# Patient Record
Sex: Male | Born: 1985 | Race: Black or African American | Hispanic: No | Marital: Single | Smoking: Never smoker
Health system: Southern US, Community
[De-identification: ages and names within clinical notes are randomized; demographics above are authoritative.]

## PROBLEM LIST (undated history)

## (undated) DIAGNOSIS — I4891 Unspecified atrial fibrillation: Secondary | ICD-10-CM

## (undated) DIAGNOSIS — R5600 Simple febrile convulsions: Secondary | ICD-10-CM

## (undated) DIAGNOSIS — I517 Cardiomegaly: Secondary | ICD-10-CM

## (undated) DIAGNOSIS — F8081 Childhood onset fluency disorder: Secondary | ICD-10-CM

## (undated) DIAGNOSIS — R9431 Abnormal electrocardiogram [ECG] [EKG]: Secondary | ICD-10-CM

## (undated) DIAGNOSIS — F410 Panic disorder [episodic paroxysmal anxiety] without agoraphobia: Secondary | ICD-10-CM

## (undated) HISTORY — DX: Unspecified atrial fibrillation: I48.91

## (undated) HISTORY — DX: Abnormal electrocardiogram (ECG) (EKG): R94.31

## (undated) HISTORY — DX: Childhood onset fluency disorder: F80.81

## (undated) HISTORY — DX: Simple febrile convulsions: R56.00

## (undated) HISTORY — DX: Cardiomegaly: I51.7

---

## 2007-06-26 ENCOUNTER — Ambulatory Visit: Payer: Self-pay | Admitting: Cardiology

## 2007-06-26 ENCOUNTER — Emergency Department (HOSPITAL_COMMUNITY): Admission: EM | Admit: 2007-06-26 | Discharge: 2007-06-26 | Payer: Self-pay | Admitting: Emergency Medicine

## 2007-06-28 ENCOUNTER — Ambulatory Visit: Payer: Self-pay

## 2007-06-28 ENCOUNTER — Encounter: Payer: Self-pay | Admitting: Cardiology

## 2007-07-10 ENCOUNTER — Ambulatory Visit: Payer: Self-pay | Admitting: Cardiology

## 2007-08-13 ENCOUNTER — Ambulatory Visit: Payer: Self-pay | Admitting: Cardiology

## 2008-09-08 ENCOUNTER — Encounter (INDEPENDENT_AMBULATORY_CARE_PROVIDER_SITE_OTHER): Payer: Self-pay | Admitting: *Deleted

## 2010-06-28 NOTE — Assessment & Plan Note (Signed)
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  We had seen him in the  Emergency Room at [facility].  He had a spell of feeling nauseated with  decreased vision and near syncope after working out on a treadmill.  He  stabilized.  He had abnormal T-waves and we saw him in consultation.  We  allowed him to go home.  His 2-D echo was done on Jun 28, 2007 and he  had normal LV function with a normal ejection fraction in the 60-70%  range.  There was a trivial pericardial effusion.  He has done well.  He  has returned to his exercise.   ALLERGIES:  No known drug allergies.   MEDICATIONS:  He is on no medicines at this time.   OTHER MEDICAL PROBLEMS:  See the list below.   REVIEW OF SYSTEMS:  He feels fine and his review of systems is negative.   PHYSICAL EXAM:  VITAL SIGNS:  Blood pressure is 122/88 with a pulse of  75.  GENERAL:  The patient is oriented to person, time, and place.  Affect is  normal.  He is here with his mother today.  HEENT:  Reveals no xanthelasma.  He has normal extraocular motion.  NECK:  There are no carotid bruits.  There is no jugular venous  distention.  LUNGS:  Lungs are clear.  Respiratory effort is not labored.  CARDIAC:  Exam reveals an S1-S2.  There are no clicks or significant  murmurs.  ABDOMEN:  His abdomen is soft.  EXTREMITIES:  He has no peripheral edema.   PROBLEMS:  Include;  1. History of some febrile seizures as a child.  2. History of some type of difficulty with stuttering over time that      may have been related to medicines he took as a child, and this      resolved.  3. Episode for which he was seen in the hospital in the Emergency  Room      on Jun 26, 2007, that was probably a vagal type of event.  4. Normal LV function by echo.  5. Abnormal EKG.  We repeated an EKG today to see if the T-wave      abnormalities remained.  The frequency response is different on our      machine compared to the hospital.  All T-wave changes in general      are less marked.  Today, his EKG does show some T-wave changes,      although they are definitely less marked.  I believe overall that      he has a mildly abnormal baseline EKG.  He does not have      significant LVH by echo.  I believe that this is a normal  variant for him.  Further workup is not needed.  I do want to see      him back in 1 year to be sure that there is no other change.  He is      to go about full activities.     Luis Abed, MD, [facility]  Electronically Signed    JDK/MedQ  DD: 08/13/2007  DT: 08/14/2007  Job #: [id number]

## 2010-06-28 NOTE — Consult Note (Signed)
NAME:  AMBERS, IYENGAR NO.:  [id number]   MEDICAL RECORD NO.:  [id number]          PATIENT TYPE:  EMS   LOCATION:  [facility]                         FACILITY:  [facility]   PHYSICIAN:  Luis Abed, MD, FACCDATE OF BIRTH:  1985-10-04   DATE OF CONSULTATION:  06/26/2007  DATE OF DISCHARGE:                                 CONSULTATION   Mr. Jeffreys is currently in the emergency room.  He is 25 years of age.  He has no history of any significant cardiac problems.  In speaking to  his mother who is now here also, he did have a problem with febrile  seizures as a child.  He was on several medications in another area and  followed by a pediatric neurologist.  The mother tells me that he  received many medications, and, at a certain point, may have had some  reactions to them with some stuttering.  Ultimately, he was taken off  all of these medicines and he has developed normally and is doing well.  He has not had any seizures in many years.   He is a Holiday representative at [employer].  He has been doing some physical workouts on a  regular basis which include some walking on the treadmill and some  weight work.  Today he ate a little different than usual, but his volume  status seemed to be okay according to him.  While doing some of his  workout he felt a nauseated sensation.  This persisted for a while, and  he then tried to go towards the bathroom and felt worse.  He had a  decrease in vision for a short period of time.  Ultimately, he was  brought to [facility] for evaluation.  It turns out that his workout  today was harder than usual, and he had not worked out for a couple of  weeks.   He did have some vomiting.  There was no documented seizure.   PAST MEDICAL HISTORY:   ALLERGIES:  No known drug allergies.   MEDICATIONS:  He is on no medications.   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  He is  planning to go to summer school.  He does not smoke.  He does  not abuse  drugs, and he does not drink alcohol.   FAMILY HISTORY:  There is no significant family history of coronary  disease, and there is no family history of sudden cardiac death.   PHYSICAL EXAMINATION:  The patient is oriented to person, time, and  place.  Affect is normal.  Temperature is 97.3, blood pressure lying is 123/68, sitting 118/78,  standing 120/69.  There is no major change in pulse.  This suggests no  major orthostatic changes.  HEENT:  Reveals no xanthelasma.  He has normal extraocular  motion.  There are no carotid bruits.  There is no jugular venous distension.  LUNGS:  Clear.  Respiratory effort is not labored.  CARDIAC EXAM:  Reveals an S1 with an S2.  There are no clicks or  significant murmurs.  The abdomen is soft.  He has no significant peripheral edema.   EKG does show T-wave inversions in leads II, III, aVF, and V3 to V6.   Chest x-ray shows no significant abnormality.   White blood count is slightly elevated at 10.6, hemoglobin is 15.7,  platelets are normal.  Sodium 139, potassium 4.2, BUN is 10, and  creatinine 1.0.  Troponin is 0.06.   The patient received IV fluid and he feels fine.  He has also eaten  since he has been here.   IMPRESSION:  1. History of febrile seizures as a child receiving multiple      medications.  2. History of some type of difficulty with stuttering over time that      may have been related to his medicines as a child and this has      resolved.  3. Episode today which most probably was a vagal reaction to not      feeling well after pushing himself with his physical exercise      program.  4. Abnormal EKG.  We have no EKG for old EKG for comparison.  I am not      convinced that this represents an acute abnormality.  He is stable      and  can go home.  He will not be exercising again until we have      him seen in the office tomorrow.  We are making arrangements for      him to have a 2-D echo and then to be seen  in followup by one of      the other cardiology partners in my group either tomorrow or the      next day to be sure that there is continuity following the patient      along.  He is stable and allowed to go home at this time.      Luis Abed, MD, [facility]  Electronically Signed     JDK/MEDQ  D:  06/26/2007  T:  06/26/2007  Job:  [id number]

## 2010-06-28 NOTE — Assessment & Plan Note (Signed)
[facility]                            CARDIOLOGY OFFICE NOTE   CARRY, ORTEZ                       MRN:          [id number]  DATE:07/10/2007                            DOB:          [birth year]    The patient seen in the [facility] clinic on Jul 10, 2007 for Dr. Simona Huh.   PRIMARY CARDIOLOGIST:  Dr. Willa Rough.   This is a 25 year old African-American male patient that Dr. Myrtis Ser saw in  the emergency room when he became nauseated and decrease in vision after  a treadmill workout and weight workout.  EKG was abnormal with T wave  inversion inferolaterally, but we had no EKG to compare it to.  He had  negative enzymes, and Dr. Myrtis Ser felt it was okay for him to go home and  have an outpatient 2-D echo.  2-D echo was performed on Jun 28, 2007.  It showed normal LV function, ejection fraction 60-70%.  There was  trivial pericardial effusion, no regional wall motion abnormalities.  Dr. Myrtis Ser looked it over and felt it was fine.   The patient has had no further episodes of nausea and visual  disturbances.  Denies any cardiac symptoms.  His  mother is concerned  because his blood pressure was up a little today here.  It was 149/79;  when I retook it was 140/80, but he did have chips and a [place] sandwich  right before he came in here.   CURRENT MEDICATIONS:  None.   PHYSICAL EXAMINATION:  GENERAL:  This is a pleasant 25 year old African-  American male in no acute distress.  VITAL SIGNS:  Blood pressure 140/80, pulse 67, weight 205.  NECK:  Without JVD, HJR, bruit or thyroid enlargement.  LUNGS:  Clear anterior, posterior, and lateral.  HEART:  Regular rate and rhythm at 67 beats per minute, normal S1, S2,  no murmur, rub, bruit, thrill or heave noted.  ABDOMEN:  Soft without  organomegaly, masses, lesions or abnormal tenderness.  EXTREMITIES:  Without cyanosis, clubbing or edema.  There are good  distal pulses.   IMPRESSION:  1.  Abnormal EKG with normal left ventricle function, ejection fraction      60-70% on 2-D echocardiogram with trivial pericardial effusion.  2. History of febrile seizures as a child.  3. History of stuttering.  4. Possible vagal reaction after physical exertion.   PLAN:  The patient is stable from a cardiac standpoint.  I have asked  him to decrease his sodium intake and keep track of his blood pressure  and bring a copy of that when he sees Dr. Myrtis Ser back in one month.      Jacolyn Reedy, PA-C  Electronically Signed      Jonelle Sidle, MD  Electronically Signed   ML/MedQ  DD: 07/10/2007  DT: 07/10/2007  Job #: [id number]   cc:   Luis Abed, MD, [facility]

## 2011-06-10 ENCOUNTER — Emergency Department (HOSPITAL_COMMUNITY)
Admission: EM | Admit: 2011-06-10 | Discharge: 2011-06-10 | Disposition: A | Discharge status: Home / Self Care | Payer: PRIVATE HEALTH INSURANCE | Attending: Emergency Medicine | Admitting: Emergency Medicine

## 2011-06-10 ENCOUNTER — Emergency Department (HOSPITAL_COMMUNITY): Payer: PRIVATE HEALTH INSURANCE

## 2011-06-10 ENCOUNTER — Encounter (HOSPITAL_COMMUNITY): Payer: Self-pay | Admitting: *Deleted

## 2011-06-10 DIAGNOSIS — I4891 Unspecified atrial fibrillation: Secondary | ICD-10-CM | POA: Insufficient documentation

## 2011-06-10 DIAGNOSIS — F410 Panic disorder [episodic paroxysmal anxiety] without agoraphobia: Secondary | ICD-10-CM | POA: Insufficient documentation

## 2011-06-10 DIAGNOSIS — R002 Palpitations: Secondary | ICD-10-CM | POA: Insufficient documentation

## 2011-06-10 HISTORY — DX: Panic disorder (episodic paroxysmal anxiety): F41.0

## 2011-06-10 LAB — BASIC METABOLIC PANEL
CO2: 24 mEq/L (ref 19–32)
Chloride: 106 mEq/L (ref 96–112)
GFR calc Af Amer: >90 mL/min (ref >90)
Sodium: 141 mEq/L (ref 135–145)

## 2011-06-10 LAB — CBC
Platelets: 191 10*3/uL (ref 150–400)
RDW: 12.1 % (ref 11.5–15.5)
WBC: 5.4 10*3/uL (ref 4.0–10.5)

## 2011-06-10 LAB — DIFFERENTIAL
Basophils Absolute: 0 10*3/uL (ref 0.0–0.1)
Lymphocytes Relative: 36 % (ref 12–46)
Neutro Abs: 3 10*3/uL (ref 1.7–7.7)
Neutrophils Relative %: 56 % (ref 43–77)

## 2011-06-10 LAB — TROPONIN I: Troponin I: <0.3 ng/mL (ref <0.30)

## 2011-06-10 LAB — RAPID URINE DRUG SCREEN, HOSP PERFORMED
Amphetamines: NOT DETECTED
Benzodiazepines: NOT DETECTED
Opiates: NOT DETECTED

## 2011-06-10 MED ORDER — SODIUM CHLORIDE 0.9 % IV SOLN
Freq: Once | INTRAVENOUS | Status: AC
  Administered 2011-06-10: 1000 mL via INTRAVENOUS

## 2011-06-10 MED ORDER — METOPROLOL TARTRATE 25 MG PO TABS
25.0000 mg | ORAL_TABLET | Freq: Once | ORAL | Status: AC
  Administered 2011-06-10: 25 mg via ORAL
  Filled 2011-06-10: qty 1

## 2011-06-10 MED ORDER — METOPROLOL TARTRATE 50 MG PO TABS: 25.0000 mg | ORAL_TABLET | Freq: Two times a day (BID) | ORAL | Status: DC

## 2011-06-10 NOTE — ED Provider Notes (Addendum)
7:37 AM  Date: 06/10/2011  Rate: 122  Rhythm: atrial fibrillation and premature ventricular contractions (PVC)  QRS Axis: normal  Intervals: normal  ST/T Wave abnormalities: normal  Conduction Disutrbances:  Incomplete right bundle branch block.  Narrative Interpretation: Abnormal EKG  Old EKG Reviewed: changes noted--was in sinus rhythm on 06/26/2007.   9:21 AM  Date: 06/10/2011  Rate: 83  Rhythm: normal sinus rhythm  QRS Axis: normal  Intervals: normal  ST/T Wave abnormalities: normal  Conduction Disutrbances:none  Narrative Interpretation: Normal EKG  Old EKG Reviewed: changes noted--was in atrial fibrillation with rapid rate earlier at 7:37 A.M.      Carleene Cooper III, MD 06/10/11 0740  Carleene Cooper III, MD 06/10/11 [phone]

## 2011-06-10 NOTE — ED Notes (Signed)
Pt states that he woke up because both of his legs were cramping. Pt states that he waited until they were better and then tried to walk to the bathroom. Pt in the bathroom started feeling nauseated and then he felt like he was getting diaphoretic and his heart started racing. Pt states that he was able to calm himself down and heard a friend had a panic attack and thought maybe he was having the same. Pt is alert and oriented able to follow commands and move extremities.

## 2011-06-10 NOTE — ED Provider Notes (Signed)
History     CSN: [id number]  Arrival date & time 06/10/11  4098   First MD Initiated Contact with Patient 06/10/11 0602      Chief Complaint  Patient presents with  . Panic Attack    (Consider location/radiation/quality/duration/timing/severity/associated sxs/prior treatment) HPI  This is a 26 year old male presents with a chief complaints of heart palpitations. Patient states he woke up this morning with bilateral leg cramps, which he has experienced for several months. Patient then felt nauseated. He proceeds to go into the bathroom, when he experiencing diaphoresis, increased heart palpitations. States symptoms lasting for about 5 minutes, improved, and then returns. Onset is acute, episodic, and currently continues. He denies lightheadedness, or dizziness. He denies chest pain or shortness of breath. He denies abdominal pain, back pain, or melena. This never experiencing his heart palpitation before. Patient denies history of panic attack. Patient mentioned that in 2005l he had a episode of near exertional syncopal after doing heavy lifting.  He did follow up with a cardiologist and was diagnosed with irregular heart rate.  Has not had any recent similar event.  Denies recent rec. Drug use.  Does sts he is trying to get back to working out for the past several months.  Denies significant cardiac hx of family hx of premature CAD.    Past Medical History  Diagnosis Date  . Panic attack     History reviewed. No pertinent past surgical history.  No family history on file.  History  Substance Use Topics  . Smoking status: Never Smoker   . Smokeless tobacco: Not on file  . Alcohol Use: No      Review of Systems  All other systems reviewed and are negative.    Allergies  Review of patient's allergies indicates no known allergies.  Home Medications  No current outpatient prescriptions on file.  BP 128/80  Pulse 80  Temp(Src) 98.2 F (36.8 C) (Oral)  Resp 18  SpO2  99%  Physical Exam  Nursing note and vitals reviewed. Constitutional: He appears well-developed and well-nourished. No distress.       Awake, alert, nontoxic appearance  HENT:  Head: Atraumatic.  Eyes: Conjunctivae are normal. Right eye exhibits no discharge. Left eye exhibits no discharge.  Neck: Normal range of motion. Neck supple.  Cardiovascular: An irregularly irregular rhythm present. Tachycardia present.   No murmur heard. Pulmonary/Chest: Effort normal. No respiratory distress. He exhibits no tenderness.  Abdominal: Soft. There is no tenderness. There is no rebound.  Musculoskeletal: He exhibits no edema and no tenderness.       ROM appears intact, no obvious focal weakness  Neurological: He is alert.  Skin: Skin is warm and dry. No rash noted.  Psychiatric: He has a normal mood and affect.    ED Course  Procedures (including critical care time)  Labs Reviewed - No data to display No results found.   No diagnosis found.   Date: 06/10/2011  Rate: 122  Rhythm: atrial fibrillation  QRS Axis: normal  Intervals: normal  ST/T Wave abnormalities: nonspecific ST/T changes  Conduction Disutrbances:paired PVC  Narrative Interpretation: afib with RVR, PVCs  Old EKG Reviewed: changes noted  Results for orders placed during the hospital encounter of 06/10/11  CBC      Component Value Range   WBC 5.4  4.0 - 10.5 (K/uL)   RBC 4.65  4.22 - 5.81 (MIL/uL)   Hemoglobin 15.6  13.0 - 17.0 (g/dL)   HCT 11.9  14.7 - 82.9 (%)  MCV 94.4  78.0 - 100.0 (fL)   MCH 33.5  26.0 - 34.0 (pg)   MCHC 35.5  30.0 - 36.0 (g/dL)   RDW 16.1  09.6 - 04.5 (%)   Platelets 191  150 - 400 (K/uL)  DIFFERENTIAL      Component Value Range   Neutrophils Relative 56  43 - 77 (%)   Neutro Abs 3.0  1.7 - 7.7 (K/uL)   Lymphocytes Relative 36  12 - 46 (%)   Lymphs Abs 1.9  0.7 - 4.0 (K/uL)   Monocytes Relative 7  3 - 12 (%)   Monocytes Absolute 0.4  0.1 - 1.0 (K/uL)   Eosinophils Relative 0  0 - 5 (%)    Eosinophils Absolute 0.0  0.0 - 0.7 (K/uL)   Basophils Relative 0  0 - 1 (%)   Basophils Absolute 0.0  0.0 - 0.1 (K/uL)  BASIC METABOLIC PANEL      Component Value Range   Sodium 141  135 - 145 (mEq/L)   Potassium 4.2  3.5 - 5.1 (mEq/L)   Chloride 106  96 - 112 (mEq/L)   CO2 24  19 - 32 (mEq/L)   Glucose, Bld 146 (*) 70 - 99 (mg/dL)   BUN 13  6 - 23 (mg/dL)   Creatinine, Ser 4.09  0.50 - 1.35 (mg/dL)   Calcium 9.4  8.4 - 81.1 (mg/dL)   GFR calc non Af Amer >90  >90 (mL/min)   GFR calc Af Amer >90  >90 (mL/min)  TROPONIN I      Component Value Range   Troponin I <0.30  <0.30 (ng/mL)   Dg Chest 2 View  06/10/2011  *RADIOLOGY REPORT*  Clinical Data:  Palpitations, diaphoresis and shortness of breath.  CHEST - 2 VIEW  Comparison: 06/26/2007  Findings: The heart size and mediastinal contours are within normal limits.  Both lungs are clear.  The visualized skeletal structures are unremarkable.  IMPRESSION: No active disease.  Original Report Authenticated By: Reola Calkins, M.D.      MDM  ECG shows afib with RVR.  Work up initiated.  Discussed care with my attending.    9:20 AM Pt has spontaneously convert back to sinus rhythm.  Repeat ECG performed.  My attending has consulted with Dr. Antoine Poche from [facility], who recommend for pt to start on metoprolol 25mg  PO once daily and to f/u in office for echocardiogram and further evaluation.  Pt were made aware of plan and agrees. Metoprolol given here in ED.  Prescription given.  Pt stable to be d/c.            Fayrene Helper, PA-C 06/10/11 [phone]

## 2011-06-10 NOTE — Discharge Instructions (Signed)
Please call [facility] office on Monday to schedule a follow up appointment for further evaluation of your atrial fibrillation.  Takes metoprolol 25mg  once daily.  Atrial Fibrillation Your caregiver has diagnosed you with atrial fibrillation (AFib). The heart normally beats very regularly; AFib is a type of irregular heartbeat. The heart rate may be faster or slower than normal. This can prevent your heart from pumping as well as it should. AFib can be constant (chronic) or intermittent (paroxysmal). CAUSES  Atrial fibrillation may be caused by:  Heart disease, including heart attack, coronary artery disease, heart failure, diseases of the heart valves, and others.   Blood clot in the lungs (pulmonary embolism).   Pneumonia or other infections.   Chronic lung disease.   Thyroid disease.   Toxins. These include alcohol, some medications (such as decongestant medications or diet pills), and caffeine.  In some people, no cause for AFib can be found. This is referred to as Lone Atrial Fibrillation. SYMPTOMS   Palpitations or a fluttering in your chest.   A vague sense of chest discomfort.   Shortness of breath.   Sudden onset of lightheadedness or weakness.  Sometimes, the first sign of AFib can be a complication of the condition. This could be a stroke or heart failure. DIAGNOSIS  Your description of your condition may make your caregiver suspicious of atrial fibrillation. Your caregiver will examine your pulse to determine if fibrillation is present. An EKG (electrocardiogram) will confirm the diagnosis. Further testing may help determine what caused you to have atrial fibrillation. This may include chest x-ray, echocardiogram, blood tests, or CT scans. PREVENTION  If you have previously had atrial fibrillation, your caregiver may advise you to avoid substances known to cause the condition (such as stimulant medications, and possibly caffeine or alcohol). You may be advised to  use medications to prevent recurrence. Proper treatment of any underlying condition is important to help prevent recurrence. PROGNOSIS  Atrial fibrillation does tend to become a chronic condition over time. It can cause significant complications (see below). Atrial fibrillation is not usually immediately life-threatening, but it can shorten your life expectancy. This seems to be worse in women. If you have lone atrial fibrillation and are under 26 years old, the risk of complications is very low, and life expectancy is not shortened. RISKS AND COMPLICATIONS  Complications of atrial fibrillation can include stroke, chest pain, and heart failure. Your caregiver will recommend treatments for the atrial fibrillation, as well as for any underlying conditions, to help minimize risk of complications. TREATMENT  Treatment for AFib is divided into several categories:  Treatment of any underlying condition.   Converting you out of AFib into a regular (sinus) rhythm.   Controlling rapid heart rate.   Prevention of blood clots and stroke.  Medications and procedures are available to convert your atrial fibrillation to sinus rhythm. However, recent studies have shown that this may not offer you any advantage, and cardiac experts are continuing research and debate on this topic. More important is controlling your rapid heartbeat. The rapid heartbeat causes more symptoms, and places strain on your heart. Your caregiver will advise you on the use of medications that can control your heart rate. Atrial fibrillation is a strong stroke risk. You can lessen this risk by taking blood thinning medications such as Coumadin (warfarin), or sometimes aspirin. These medications need close monitoring by your caregiver. Over-medication can cause bleeding. Too little medication may not protect against stroke. HOME CARE INSTRUCTIONS   If  your caregiver prescribed medicine to make your heartbeat more normally, take as directed.    If blood thinners were prescribed by your caregiver, take EXACTLY as directed.   Perform blood tests EXACTLY as directed.   Quit smoking. Smoking increases your cardiac and lung (pulmonary) risks.   DO NOT drink alcohol.   DO NOT drink caffeinated drinks (e.g. coffee, soda, chocolate, and leaf teas). You may drink decaffeinated coffee, soda or tea.   If you are overweight, you should choose a reduced calorie diet to lose weight. Please see a registered dietitian if you need more information about healthy weight loss. DO NOT USE DIET PILLS as they may aggravate heart problems.   If you have other heart problems that are causing AFib, you may need to eat a low salt, fat, and cholesterol diet. Your caregiver will tell you if this is necessary.   Exercise every day to improve your physical fitness. Stay active unless advised otherwise.   If your caregiver has given you a follow-up appointment, it is very important to keep that appointment. Not keeping the appointment could result in heart failure or stroke. If there is any problem keeping the appointment, you must call back to this facility for assistance.  SEEK MEDICAL CARE IF:  You notice a change in the rate, rhythm or strength of your heartbeat.   You develop an infection or any other change in your overall health status.  SEEK IMMEDIATE MEDICAL CARE IF:   You develop chest pain, abdominal pain, sweating, weakness or feel sick to your stomach (nausea).   You develop shortness of breath.   You develop swollen feet and ankles.   You develop dizziness, numbness, or weakness of your face or limbs, or any change in vision or speech.  MAKE SURE YOU:   Understand these instructions.   Will watch your condition.   Will get help right away if you are not doing well or get worse.  Document Released: 01/30/2005 Document Revised: 01/19/2011 Document Reviewed: 09/04/2007 [facility] Patient Information 2012 [place], [place].

## 2011-06-10 NOTE — ED Provider Notes (Signed)
Medical screening examination/treatment/procedure(s) were performed by non-physician practitioner and as supervising physician I was immediately available for consultation/collaboration.  Jasmine Awe, MD 06/10/11 2333

## 2011-06-10 NOTE — ED Notes (Signed)
The pt woke up approx one hour ago with bi-lateral leg cramps nauseated.  He went to the br feeling more nauseated started swaeting and feeling like his heart was racing.  He has a history of panic attacks.  At present he feels strange .  No pain

## 2011-06-14 ENCOUNTER — Encounter: Payer: Self-pay | Admitting: *Deleted

## 2011-06-15 ENCOUNTER — Encounter: Payer: Self-pay | Admitting: Cardiovascular Disease

## 2011-06-15 ENCOUNTER — Ambulatory Visit (INDEPENDENT_AMBULATORY_CARE_PROVIDER_SITE_OTHER): Payer: PRIVATE HEALTH INSURANCE | Admitting: Cardiovascular Disease

## 2011-06-15 VITALS — BP 128/73 | HR 64 | Wt 224.0 lb

## 2011-06-15 DIAGNOSIS — I480 Paroxysmal atrial fibrillation: Secondary | ICD-10-CM

## 2011-06-15 DIAGNOSIS — I493 Ventricular premature depolarization: Secondary | ICD-10-CM | POA: Insufficient documentation

## 2011-06-15 DIAGNOSIS — I4891 Unspecified atrial fibrillation: Secondary | ICD-10-CM

## 2011-06-15 DIAGNOSIS — I4949 Other premature depolarization: Secondary | ICD-10-CM

## 2011-06-15 DIAGNOSIS — R55 Syncope and collapse: Secondary | ICD-10-CM

## 2011-06-15 NOTE — Patient Instructions (Signed)
Your physician recommends that you schedule a follow-up appointment in: AFTER TEST DONE Your physician recommends that you continue on your current medications as directed. Please refer to the Current Medication list given to you today. Your physician has requested that you have an echocardiogram. Echocardiography is a painless test that uses sound waves to create images of your heart. It provides your doctor with information about the size and shape of your heart and how well your heart's chambers and valves are working. This procedure takes approximately one hour. There are no restrictions for this procedure. DX 427.31 Your physician has requested that you have an exercise tolerance test. For further information please visit [email or web address] Please also follow instruction sheet, as given. DX 427.31

## 2011-06-15 NOTE — Assessment & Plan Note (Addendum)
Previous episodes of vasovagal like syncope.  Negative w/u by Dr Myrtis Ser in 2009 F/U ETT and echo and consider MRI depending on these results  No significant family history and previous echo 2009 with no HOCM  Labile T waves noted by Dr Myrtis Ser and may make MRI useful to R.O infiltrative disease.

## 2011-06-15 NOTE — Progress Notes (Addendum)
Patient ID: Jack Jordan, male   DOB: 1985/08/15, 26 y.o.   MRN: [id number] Seen in ER 4/27 with chief complaint of  heart palpitations. Patient states he woke up in morning with bilateral leg cramps, which he has experienced for several months. Patient then felt nauseated. He proceeds to go into the bathroom, when he experiencing diaphoresis, increased heart palpitations. States symptoms lasting for about 5 minutes, improved, and then returns. Onset is acute, episodic, and currently continues. He denies lightheadedness, or dizziness. He denies chest pain or shortness of breath. He denies abdominal pain, back pain, or melena. This never experiencing his heart palpitation before. Patient denies history of panic attack. Patient mentioned that in 2009 he had a episode of near exertional syncopal after doing heavy lifting. Reviewed these records as he saw Dr Myrtis Ser 07/2007 and had normal echo with thoughts that he had a vasovagal episode.  Marland Kitchen Has not had any recent similar event. Denies recent rec. Drug use. Does sts he is trying to get back to working out for the past several months. Denies significant cardiac hx of family hx of premature CAD.   ROS: Denies fever, malais, weight loss, blurry vision, decreased visual acuity, cough, sputum, SOB, hemoptysis, pleuritic pain, palpitaitons, heartburn, abdominal pain, melena, lower extremity edema, claudication, or rash.  All other systems reviewed and negative  General: Affect appropriate Overweight black male HEENT: normal Neck supple with no adenopathy JVP normal no bruits no thyromegaly Lungs clear with no wheezing and good diaphragmatic motion Heart:  S1/S2 no murmur, no rub, gallop or click PMI normal Abdomen: benighn, BS positve, no tenderness, no AAA no bruit.  No HSM or HJR Distal pulses intact with no bruits No edema Neuro non-focal Skin warm and dry No muscular weakness   Current Outpatient Prescriptions  Medication Sig Dispense Refill  .  metoprolol (LOPRESSOR) 50 MG tablet Take 0.5 tablets (25 mg total) by mouth 2 (two) times daily.  20 tablet  0    Allergies  Review of patient's allergies indicates no known allergies.  Electrocardiogram:  4/28  Afib with PVC;s and nonspecfic ST/T wave changes  4/27  NSR rate 83  Normal  Today NSR rate 69  Inferolateral T wave changes  Assessment and Plan

## 2011-06-15 NOTE — Assessment & Plan Note (Signed)
Somewhat unusual in patient this age.  May have been vagally mediated.  F/U Echo  In NSR and will continue lopresser for now  ASA

## 2011-06-15 NOTE — Assessment & Plan Note (Signed)
Normal exam  Echo to R/O structural heart disease and ETT to R/O exercise induced arrhythmia

## 2011-06-21 NOTE — Progress Notes (Signed)
Addended by: Vista Mink D on: 06/21/2011 11:20 AM   Modules accepted: Orders

## 2011-06-28 ENCOUNTER — Encounter: Payer: Self-pay | Admitting: Physician Assistant

## 2011-06-28 ENCOUNTER — Ambulatory Visit (INDEPENDENT_AMBULATORY_CARE_PROVIDER_SITE_OTHER): Payer: PRIVATE HEALTH INSURANCE | Admitting: Physician Assistant

## 2011-06-28 DIAGNOSIS — I4891 Unspecified atrial fibrillation: Secondary | ICD-10-CM

## 2011-06-28 NOTE — Procedures (Signed)
Exercise Treadmill Test  Pre-Exercise Testing Evaluation Rhythm: normal sinus  Rate: 76   PR:  .13 QRS:  .07  QT:  .34 QTc: .38     Test  Exercise Tolerance Test Ordering MD: Charlton Haws, MD  Interpreting MD: Tereso Newcomer PA-C  Unique Test No: 1  Treadmill:  1  Indication for ETT: A-FIB  Contraindication to ETT: No   Stress Modality: exercise - treadmill  Cardiac Imaging Performed: non   Protocol: standard Bruce - maximal  Max BP:  211/72  Max MPHR (bpm):  195 85% MPR (bpm):  166  MPHR obtained (bpm):  173 % MPHR obtained:  88%  Reached 85% MPHR (min:sec):  8:24 Total Exercise Time (min-sec):  9:26  Workload in METS:  10.8 Borg Scale: 18  Reason ETT Terminated:  patient's desire to stop    ST Segment Analysis At Rest: normal ST segments - no evidence of significant ST depression - baseline inf-Lat TW inversions With Exercise: no evidence of significant ST depression - TW inversions resolved during stress  Other Information Arrhythmia:  Rare PVC Angina during ETT:  absent (0) Quality of ETT:  diagnostic  ETT Interpretation:  normal - no evidence of ischemia by ST analysis  Comments: Good exercise tolerance. No chest pain. Hypertensive BP response to exercise. No ST-T changes to suggest ischemia.  No exercised induced arrhythmias.   Recommendations: Follow up with Dr. Charlton Haws as directed. Tereso Newcomer, PA-C  2:57 PM 06/28/2011

## 2011-06-29 ENCOUNTER — Ambulatory Visit (HOSPITAL_COMMUNITY): Payer: PRIVATE HEALTH INSURANCE | Attending: Cardiology

## 2011-06-29 ENCOUNTER — Encounter: Payer: Self-pay | Admitting: Cardiovascular Disease

## 2011-06-29 ENCOUNTER — Ambulatory Visit (INDEPENDENT_AMBULATORY_CARE_PROVIDER_SITE_OTHER): Payer: PRIVATE HEALTH INSURANCE | Admitting: Cardiovascular Disease

## 2011-06-29 ENCOUNTER — Other Ambulatory Visit: Payer: Self-pay

## 2011-06-29 ENCOUNTER — Other Ambulatory Visit (HOSPITAL_COMMUNITY): Payer: Self-pay | Admitting: Radiology

## 2011-06-29 VITALS — BP 120/76 | HR 76 | Ht 70.0 in | Wt 226.8 lb

## 2011-06-29 DIAGNOSIS — R55 Syncope and collapse: Secondary | ICD-10-CM | POA: Insufficient documentation

## 2011-06-29 DIAGNOSIS — I4891 Unspecified atrial fibrillation: Secondary | ICD-10-CM

## 2011-06-29 DIAGNOSIS — R002 Palpitations: Secondary | ICD-10-CM | POA: Insufficient documentation

## 2011-06-29 DIAGNOSIS — I10 Essential (primary) hypertension: Secondary | ICD-10-CM

## 2011-06-29 DIAGNOSIS — I480 Paroxysmal atrial fibrillation: Secondary | ICD-10-CM

## 2011-06-29 MED ORDER — METOPROLOL SUCCINATE ER 50 MG PO TB24: 50.0000 mg | ORAL_TABLET | Freq: Every day | ORAL | Status: AC

## 2011-06-29 NOTE — Patient Instructions (Signed)
Your physician recommends that you schedule a follow-up appointment in:  3 MONTHS WITH DR [facility] Your physician has recommended you make the following change in your medication: STOP LOPRESSOR AND START TOPROL XL 50  MG You have been referred to  DR Graciela Husbands   ASAP PER DR Graciela Husbands FOR DX  AFIB

## 2011-06-29 NOTE — Assessment & Plan Note (Signed)
HTN response to exercise and mild LVH.  Beta blocker held for test.  Prefers once daily drug.  Change to Toprol 50mg  and F/U in 3 months.  He will buy a BP cuff and will monitor at home

## 2011-06-29 NOTE — Progress Notes (Signed)
Patient ID: Jack Jordan, male   DOB: April 14, 1985, 26 y.o.   MRN: [id number] Seen in ER 4/27 with chief complaint of heart palpitations. Patient states he woke up in morning with bilateral leg cramps, which he has experienced for several months. Patient then felt nauseated. He proceeds to go into the bathroom, when he experiencing diaphoresis, increased heart palpitations. States symptoms lasting for about 5 minutes, improved, and then returns. Onset is acute, episodic, and currently continues. He denies lightheadedness, or dizziness. He denies chest pain or shortness of breath. He denies abdominal pain, back pain, or melena. This never experiencing his heart palpitation before. Patient denies history of panic attack. Patient mentioned that in 2009 he had a episode of near exertional syncopal after doing heavy lifting. Reviewed these records as he saw Dr Myrtis Ser 07/2007 and had normal echo with thoughts that he had a vasovagal episode. Marland Kitchen Has not had any recent similar event. Denies recent rec. Drug use. Does sts he is trying to get back to working out for the past several months. Denies significant cardiac hx of family hx of premature CAD.   F/U ETT was normal with HTN response held beta blocker for test Reviewed echo from today.  Mild LVH septum  11.5 mm otherwise normal.  No new complaints.    ROS: Denies fever, malais, weight loss, blurry vision, decreased visual acuity, cough, sputum, SOB, hemoptysis, pleuritic pain, palpitaitons, heartburn, abdominal pain, melena, lower extremity edema, claudication, or rash.  All other systems reviewed and negative  General: Affect appropriate Healthy:  appears stated age HEENT: normal Neck supple with no adenopathy JVP normal no bruits no thyromegaly Lungs clear with no wheezing and good diaphragmatic motion Heart:  S1/S2 no murmur, no rub, gallop or click PMI normal Abdomen: benighn, BS positve, no tenderness, no AAA no bruit.  No HSM or HJR Distal pulses  intact with no bruits No edema Neuro non-focal Skin warm and dry No muscular weakness   No current outpatient prescriptions on file.    Allergies  Review of patient's allergies indicates no known allergies.  Electrocardiogram: 06/15/11 SR rate 69 biphasic T waves infero and laterally  Assessment and Plan

## 2011-06-29 NOTE — Assessment & Plan Note (Signed)
Resolved with fairly benign ETT and echo.  However I have discussed case with Dr Graciela Husbands and would like him to see since it is somewhat unusual for someone this young with abnormal T waves on ECG to have PAF.  Continue beta blocker

## 2011-07-04 ENCOUNTER — Telehealth: Payer: Self-pay | Admitting: *Deleted

## 2011-07-04 NOTE — Telephone Encounter (Signed)
Lorne Skeens C More Detail >>      Sherrilyn Rist        Sent: Fri Jun 30, 2011  3:31 PM    To: Jefferey Pica, RN        [place]    MRN: [id number] DOB: [birth year]     Pt Home: [phone]               Message     Herbert Seta, call patient several time left message on home voice mail thanks gesila

## 2011-07-13 ENCOUNTER — Telehealth: Payer: Self-pay | Admitting: *Deleted

## 2011-07-13 NOTE — Telephone Encounter (Signed)
PT NEEDS F/U WITH DR Graciela Husbands UNABLE TO REACH PT  HAVE LEFT SEVERAL MESSAGES . WILL AWAIT RETURN CALL FROM PT .Zack Seal

## 2011-07-14 ENCOUNTER — Telehealth: Payer: Self-pay | Admitting: Cardiovascular Disease

## 2011-07-14 NOTE — Telephone Encounter (Signed)
New problem:  Message sent to scheduler Marlowe Kays  On 5/6 & 5/15  by Scherrie Bateman & Bryson Corona to make an appt to see Dr. Graciela Husbands. scheduler has made several attempts to reach patient at number provider in his demographic . Left message on home voice mail. Marlowe Kays call this am unable to reach patient left message on home voice mail. Message forward to both nurses.

## 2011-07-28 ENCOUNTER — Encounter: Payer: Self-pay | Admitting: *Deleted

## 2011-07-28 NOTE — Telephone Encounter (Signed)
LETTER SENT TO PT RE THE NEED TO MAKE AN APPT WITH DR Graciela Husbands  AND ALSO GAVE ECHO RESULTS .Zack Seal

## 2011-08-28 ENCOUNTER — Encounter: Payer: Self-pay | Admitting: Internal Medicine

## 2011-08-28 ENCOUNTER — Ambulatory Visit (INDEPENDENT_AMBULATORY_CARE_PROVIDER_SITE_OTHER): Payer: PRIVATE HEALTH INSURANCE | Admitting: Internal Medicine

## 2011-08-28 VITALS — BP 130/87 | HR 74 | Ht 70.0 in | Wt 221.0 lb

## 2011-08-28 DIAGNOSIS — I4891 Unspecified atrial fibrillation: Secondary | ICD-10-CM

## 2011-08-28 DIAGNOSIS — R55 Syncope and collapse: Secondary | ICD-10-CM

## 2011-08-28 DIAGNOSIS — I480 Paroxysmal atrial fibrillation: Secondary | ICD-10-CM

## 2011-08-28 DIAGNOSIS — I10 Essential (primary) hypertension: Secondary | ICD-10-CM

## 2011-08-28 DIAGNOSIS — R9431 Abnormal electrocardiogram [ECG] [EKG]: Secondary | ICD-10-CM

## 2011-08-28 NOTE — Assessment & Plan Note (Signed)
As above.

## 2011-08-28 NOTE — Assessment & Plan Note (Signed)
The episode of his atrial fibrillation occurred in the setting of vagal stimulation. I suspect that it will recur at some point. Hopefully that'll be a long time from now. Structurally his left atrium was described as normal.  I am concerned however about its relationship with the left ventricular wall thickness dimensions which were described 4 years ago as normal but now are notably not so. With his abnormal electrocardiogram and diffuse T-wave inversions I wonder whether he isn't early on in his manifestations of hypertrophic cardiomyopathy. We'll plan to follow him serially over time and will recheck his ultrasound again in about one year.

## 2011-08-28 NOTE — Patient Instructions (Signed)
Your physician wants you to follow-up in: 1 year with Dr. Graciela Husbands. You will receive a reminder letter in the mail two months in advance. If you don't receive a letter, please call our office to schedule the follow-up appointment.  Your physician has requested that you have an echocardiogram in 1 year, just prior to follow up with Dr. Graciela Husbands. Echocardiography is a painless test that uses sound waves to create images of your heart. It provides your doctor with information about the size and shape of your heart and how well your heart's chambers and valves are working. This procedure takes approximately one hour. There are no restrictions for this procedure.  Your physician recommends that you continue on your current medications as directed. Please refer to the Current Medication list given to you today.

## 2011-08-28 NOTE — Assessment & Plan Note (Signed)
Review of the data suggests that this was a vagal episode with hypotension and occurring in the context of vagal stimulation. He is advised to be cognizant of the prodrome

## 2011-08-28 NOTE — Progress Notes (Signed)
CARDIOLOGY CONSULT NOTE  Patient ID: Jack Jordan, MRN: [id number], DOB/AGE: 04/27/1985 26 y.o. Admit date: (Not on file) Date of Consult: 08/28/2011  Primary Physician: No primary provider on file. Primary Cardiologist: PN  Chief Complaint:  Atrial fibrillation   HPI [facility] is a 26 y.o. male : Seen at the request of Dr. Jamse Mead because of atrial fibrillation identified in the emergency room in May. He had presented at that time with tachycardia palpitations occurring in the context of being nauseated. It terminated spontaneously. His electrocardiogram was noted to have diffuse T-wave inversions that were not deep. These have been noted to be abnormal a couple years before. A repeat ultrasound was obtained and demonstrated mild left ventricular hypertrophy on this occasion with dimensions of about 11-12 mm; an echo in 2009 had demonstrated wall sizes of 8-9 mm. There is no family history of heart disease.  The patient has a remote history of syncope. This occurred in the context of again having been nauseated. The EMS report was tracked down and she was are noted in the ER  She denies use of alcohol recreational drugs associated with the most recent event. There have been a stressful encounter with his mom that day.  Past Medical History  Diagnosis Date  . Panic attack   . Febrile seizures   . Stuttering       Surgical History: No past surgical history on file.   Home Meds: Prior to Admission medications   Medication Sig Start Date End Date Taking? Authorizing Provider  metoprolol succinate (TOPROL-XL) 50 MG 24 hr tablet Take 1 tablet (50 mg total) by mouth daily. Take with or immediately following a meal. 06/29/11 06/28/12 Yes Wendall Stade, MD     Allergies: No Known Allergies  History   Social History  . Marital Status: Single    Spouse Name: N/A    Number of Children: N/A  . Years of Education: N/A   Occupational History  . Not on file.   Social History Main  Topics  . Smoking status: Never Smoker   . Smokeless tobacco: Never Used  . Alcohol Use: No  . Drug Use: Not on file  . Sexually Active: Not on file   Other Topics Concern  . Not on file   Social History Narrative  . No narrative on file     No family history on file.  The ROS:  Please see the history of present illness.    All other systems reviewed and negative.    Physical Exam: Blood pressure 130/87, pulse 74, height 5\' 10"  (1.778 m), weight 221 lb (100.245 kg). General: Well developed, well nourished male in no acute distress. Head: Normocephalic, atraumatic, sclera non-icteric, no xanthomas, nares are without discharge. Lymph Nodes:  none Neck: Negative for carotid bruits. JVD not elevated. Lungs: Clear bilaterally to auscultation without wheezes, rales, or rhonchi. Breathing is unlabored. Heart: RRR with S1 S2. No murmurs, rubs, or gallops appreciated.  Back without kyphosis scoliosis Abdomen: Soft, non-tender, non-distended with normoactive bowel sounds. No hepatomegaly. No rebound/guarding. No obvious abdominal masses. Msk:  Stren gth and tone appear normal for age. Extremities: No clubbing or cyanosis. No edema.  Distal pedal pulses are 2+ and equal bilaterally. Skin: Warm and Dry Neuro: Alert and oriented X 3. CN III-XII intact Grossly normal sensory and motor function . Psych:  Responds to questions appropriately with a normal affect.      Labs: Cardiac Enzymes No results found for this basename: CKTOTAL:4,CKMB:4,TROPONINI:4  in the last 72 hours CBC Lab Results  Component Value Date   WBC 5.4 06/10/2011   HGB 15.6 06/10/2011   HCT 43.9 06/10/2011   MCV 94.4 06/10/2011   PLT 191 06/10/2011   PROTIME: No results found for this basename: LABPROT:3,INR:3 in the last 72 hours Chemistry No results found for this basename: NA,K,CL,CO2,BUN,CREATININE,CALCIUM,LABALBU,PROT,BILITOT,ALKPHOS,ALT,AST,GLUCOSE in the last 168 hours Lipids No results found for this  basename: CHOL, HDL, LDLCALC, TRIG   BNP No results found for this basename: probnp   Miscellaneous No results found for this basename: DDIMER    Radiology/Studies:  No results found.  EKG:  sinus rhythm at 62 intervals 13/08/4 to excellent axis XLII T-wave inversions 45F and V4 to V6    Assessment and Plan:  Sherryl Manges

## 2011-08-28 NOTE — Assessment & Plan Note (Signed)
He has borderline elevated blood pressure. We'll need to follow this closely over time. It is noted to be present in his family history.

## 2011-09-29 ENCOUNTER — Encounter: Payer: Self-pay | Admitting: Cardiovascular Disease

## 2011-09-29 ENCOUNTER — Ambulatory Visit (INDEPENDENT_AMBULATORY_CARE_PROVIDER_SITE_OTHER): Payer: PRIVATE HEALTH INSURANCE | Admitting: Cardiovascular Disease

## 2011-09-29 VITALS — BP 136/79 | HR 63 | Resp 18 | Ht 69.0 in | Wt 221.8 lb

## 2011-09-29 DIAGNOSIS — I4891 Unspecified atrial fibrillation: Secondary | ICD-10-CM

## 2011-09-29 DIAGNOSIS — R55 Syncope and collapse: Secondary | ICD-10-CM

## 2011-09-29 DIAGNOSIS — I480 Paroxysmal atrial fibrillation: Secondary | ICD-10-CM

## 2011-09-29 DIAGNOSIS — I10 Essential (primary) hypertension: Secondary | ICD-10-CM

## 2011-09-29 DIAGNOSIS — I421 Obstructive hypertrophic cardiomyopathy: Secondary | ICD-10-CM

## 2011-09-29 NOTE — Assessment & Plan Note (Signed)
Continue Toprol  Patient to get BP cuff and monitor

## 2011-09-29 NOTE — Assessment & Plan Note (Signed)
Maint NSR Thought to be vagally mediated Stable

## 2011-09-29 NOTE — Progress Notes (Signed)
Patient ID: Jack Jordan, male   DOB: [birth year], 26 y.o.   MRN: [id number] F/U for PAF  Initially seen in ER 4/13.  Negative w/u to date.  Similar vagal mediated episodes in 2009.    F/U ETT was normal with HTN response held beta blocker for test  Reviewed echo from today. Mild LVH septum 11.5 mm otherwise normal.   Saw Dr Graciela Husbands who had some concerns about incipient HOCM.  Septal thickness only 11 mm.  Needs F/U echo 7/14 Chronically abnormal ECG  With biphasic T waves in inferolateral leads  Going back to school.  Still reading comics  Working out without symptoms and trying to focus on aerobics and not heavy weights Complains of cost of Toprol  ROS: Denies fever, malais, weight loss, blurry vision, decreased visual acuity, cough, sputum, SOB, hemoptysis, pleuritic pain, palpitaitons, heartburn, abdominal pain, melena, lower extremity edema, claudication, or rash.  All other systems reviewed and negative  General: Affect appropriate Healthy:  appears stated age HEENT: normal Neck supple with no adenopathy JVP normal no bruits no thyromegaly Lungs clear with no wheezing and good diaphragmatic motion Heart:  S1/S2 no murmur, no rub, gallop or click PMI normal Abdomen: benighn, BS positve, no tenderness, no AAA no bruit.  No HSM or HJR Distal pulses intact with no bruits No edema Neuro non-focal Skin warm and dry No muscular weakness   Current Outpatient Prescriptions  Medication Sig Dispense Refill  . metoprolol succinate (TOPROL-XL) 50 MG 24 hr tablet Take 1 tablet (50 mg total) by mouth daily. Take with or immediately following a meal.  30 tablet  11    Allergies  Review of patient's allergies indicates no known allergies.  Electrocardiogram:  08/28/11  NSR rate 62  Biphasic T waves in inferolateral leads  Assessment and Plan

## 2011-09-29 NOTE — Assessment & Plan Note (Signed)
Nonrecurrent Normal ETT and echo with chronically abnormal ECG  F/U echo in a year to R/O incipient HOCM.

## 2011-09-29 NOTE — Addendum Note (Signed)
Addended by: Scherrie Bateman E on: 09/29/2011 09:39 AM   Modules accepted: Orders

## 2011-09-29 NOTE — Patient Instructions (Signed)
Your physician wants you to follow-up in: SEE DR [facility] IN July  2014 WITH ECHO SAME DAY  You will receive a reminder letter in the mail two months in advance. If you don't receive a letter, please call our office to schedule the follow-up appointment. Your physician recommends that you continue on your current medications as directed. Please refer to the Current Medication list given to you today. Your physician has requested that you have an echocardiogram. Echocardiography is a painless test that uses sound waves to create images of your heart. It provides your doctor with information about the size and shape of your heart and how well your heart's chambers and valves are working. This procedure takes approximately one hour. There are no restrictions for this procedure. DX RO  HOCM

## 2017-07-02 ENCOUNTER — Telehealth: Payer: Self-pay | Admitting: *Deleted

## 2017-07-02 NOTE — Telephone Encounter (Signed)
Referral sent to scheduling from [place], [place] [phone]

## 2017-09-19 NOTE — Progress Notes (Signed)
Cardiology Office Note   Date:  09/20/2017   ID:  Jack Jordan, DOB 12-Oct-1985, MRN [id number]  PCP:  Patient, No Pcp Per  Cardiologist:   Jack HawsPeter Nayali Talerico, MD   No chief complaint on file.     History of Present Illness: Jack Jordan is a 32 y.o. male who presents for consultation regarding dyspnea.  Referred by  Jack Jordan last seen by myself and Dr Jack Jordan in 2013 for abnormal ECG syncope and PAF that Was thought to be vagally mediated TTE showed mild LVH septal thickness 11-12 mm. ETT normal except HTN response Has chronically abnormal ECG with inferior lateral biphasic T waves. Was on beta blocker at that time  ECG reviewed from primary office SR rate 68 biphasic T waves inferolateral leads 07/02/17  Working at [employer], [employer] [place] [employer] and sensation that heart is clinching when pushed Too hard at work. Also with exertional dyspnea No wheezing cough Non smoker   Past Medical History:  Diagnosis Date  . Abnormal electrocardiogram    Diffuse T-wave inversion  . Atrial fibrillation (HCC)   . Febrile seizures (HCC)   . Left ventricular hypertrophy    Identified on echo  . Panic attack   . Stuttering     History reviewed. No pertinent surgical history.   Current Outpatient Medications  Medication Sig Dispense Refill  . Continuous Blood Gluc Receiver (FREESTYLE LIBRE 14 DAY READER) DEVI as directed.    . Continuous Blood Gluc Sensor (FREESTYLE LIBRE 14 DAY SENSOR) MISC as directed.    . metFORMIN (GLUCOPHAGE) 500 MG tablet Take by mouth as directed.    . metoprolol succinate (TOPROL-XL) 50 MG 24 hr tablet Take 1 tablet (50 mg total) by mouth daily. Take with or immediately following a meal. 30 tablet 11   No current facility-administered medications for this visit.     Allergies:   Patient has no known allergies.    Social History:  The patient  reports that he has never smoked. He has never used smokeless tobacco. He reports that he does not  drink alcohol.   Family History:  The patient's family history is not on file.    ROS:  Please see the history of present illness.   Otherwise, review of systems are positive for none.   All other systems are reviewed and negative.    PHYSICAL EXAM: VS:  BP 128/86   Pulse 75   Ht 5\' 9"  (1.753 m)   Wt 177 lb 3.2 oz (80.4 kg)   SpO2 98%   BMI 26.17 kg/m  , BMI Body mass index is 26.17 kg/m. Affect appropriate Healthy:  appears stated age HEENT: normal Neck supple with no adenopathy JVP normal no bruits no thyromegaly Lungs clear with no wheezing and good diaphragmatic motion Heart:  S1/S2 no murmur, no rub, gallop or click PMI normal Abdomen: benighn, BS positve, no tenderness, no AAA no bruit.  No HSM or HJR Distal pulses intact with no bruits No edema Neuro non-focal Skin warm and dry No muscular weakness    EKG:  2013 SR rate 82 inferolateral biphasic T waves 09/20/17 SR rate 68 nonspecific ST changes    Recent Labs: No results found for requested labs within last 8760 hours.    Lipid Panel No results found for: CHOL, TRIG, HDL, CHOLHDL, VLDL, LDLCALC, LDLDIRECT    Wt Readings from Last 3 Encounters:  09/20/17 177 lb 3.2 oz (80.4 kg)  09/29/11 221 lb 12.8 oz (100.6 kg)  08/28/11 221 lb (100.2 kg)      Other studies Reviewed: Additional studies/ records that were reviewed today include: Notes from primary , Notes Dr Jack Jordan and old cardiology records 2013 TTE and ECG;s.    ASSESSMENT AND PLAN:  1.  Dyspnea:  Normal exam f/u TTE assess RV/LV function  2. Abnormal ECG:  Chronic normal for this patient  3. HTN: Well controlled.  Continue current medications and low sodium Dash type diet.   4. DM:  Discussed low carb diet.  Target hemoglobin A1c is 6.5 or less.  Continue current medications.  5. Chest Pain: discussed options with abnormal ECG cannot have simple POT.  Favor cardiac CTA BMET today     Current medicines are reviewed at length with the patient  today.  The patient does not have concerns regarding medicines.  The following changes have been made:  None   Labs/ tests ordered today include: cardiac CT and TTE  No orders of the defined types were placed in this encounter.    Disposition:   FU with cardiology in a year pending tests      Signed, Jack Haws, MD  09/20/2017 8:34 AM    [facility] [address] [place], [place], [place]  [address] Phone: [phone]; Fax: [phone]

## 2017-09-20 ENCOUNTER — Ambulatory Visit (INDEPENDENT_AMBULATORY_CARE_PROVIDER_SITE_OTHER): Payer: Managed Care, Other (non HMO) | Admitting: Cardiovascular Disease

## 2017-09-20 ENCOUNTER — Encounter: Payer: Self-pay | Admitting: Cardiovascular Disease

## 2017-09-20 VITALS — BP 128/86 | HR 75 | Ht 69.0 in | Wt 177.2 lb

## 2017-09-20 DIAGNOSIS — R0600 Dyspnea, unspecified: Secondary | ICD-10-CM

## 2017-09-20 DIAGNOSIS — R9431 Abnormal electrocardiogram [ECG] [EKG]: Secondary | ICD-10-CM

## 2017-09-20 DIAGNOSIS — I10 Essential (primary) hypertension: Secondary | ICD-10-CM | POA: Diagnosis not present

## 2017-09-20 DIAGNOSIS — R079 Chest pain, unspecified: Secondary | ICD-10-CM

## 2017-09-20 LAB — BASIC METABOLIC PANEL
BUN/Creatinine Ratio: 26 — ABNORMAL HIGH (ref 9–20)
BUN: 23 mg/dL — ABNORMAL HIGH (ref 6–20)
CO2: 23 mmol/L (ref 20–29)
CREATININE: 0.88 mg/dL (ref 0.76–1.27)
Calcium: 9.5 mg/dL (ref 8.7–10.2)
Chloride: 105 mmol/L (ref 96–106)
GFR calc non Af Amer: 114 mL/min/{1.73_m2} (ref >59)
GFR, EST AFRICAN AMERICAN: 131 mL/min/{1.73_m2} (ref >59)
Glucose: 68 mg/dL (ref 65–99)
Potassium: 4.4 mmol/L (ref 3.5–5.2)
Sodium: 142 mmol/L (ref 134–144)

## 2017-09-20 NOTE — Patient Instructions (Addendum)
Medication Instructions:  Your physician recommends that you continue on your current medications as directed. Please refer to the Current Medication list given to you today.  Labwork: Your physician recommends that you have lab work today BMET   Testing/Procedures: Your physician has requested that you have cardiac CT. Cardiac computed tomography (CT) is a painless test that uses an x-ray machine to take clear, detailed pictures of your heart. For further information please visit [email or web address]. Please follow instruction sheet as given.  Your physician has requested that you have an echocardiogram. Echocardiography is a painless test that uses sound waves to create images of your heart. It provides your doctor with information about the size and shape of your heart and how well your heart's chambers and valves are working. This procedure takes approximately one hour. There are no restrictions for this procedure.  Follow-Up: Your physician wants you to follow-up in: 12 months with Dr. Eden EmmsNishan. You will receive a reminder letter in the mail two months in advance. If you don't receive a letter, please call our office to schedule the follow-up appointment.   If you need a refill on your cardiac medications before your next appointment, please call your pharmacy.  [facility] at xx:xx AM (30-45 minutes prior to test start time)  [facility] [address] [place], [place] [address] [phone]  Proceed [facility] Department (First Floor).  Please follow these instructions carefully (unless otherwise directed):  Hold all erectile dysfunction medications at least 48 hours prior to test.  On the Night Before the Test: . Drink plenty of water. . Do not consume any caffeinated/decaffeinated beverages or chocolate 12 hours prior to your test. . Do not take any antihistamines 12 hours prior to your  test.  On the Day of the Test: . Drink plenty of water. Do not drink any water within one hour of the test. . Do not eat any food 4 hours prior to the test. . You may take your regular medications prior to the test.  After the Test: . Drink plenty of water. . After receiving IV contrast, you may experience a mild flushed feeling. This is normal. . On occasion, you may experience a mild rash up to 24 hours after the test. This is not dangerous. If this occurs, you can take Benadryl 25 mg and increase your fluid intake. . If you experience trouble breathing, this can be serious. If it is severe call 911 IMMEDIATELY. If it is mild, please call our office.

## 2017-09-27 ENCOUNTER — Other Ambulatory Visit: Payer: Self-pay

## 2017-09-27 ENCOUNTER — Ambulatory Visit (HOSPITAL_COMMUNITY): Payer: Managed Care, Other (non HMO) | Attending: Cardiology

## 2017-09-27 DIAGNOSIS — I517 Cardiomegaly: Secondary | ICD-10-CM | POA: Diagnosis not present

## 2017-09-27 DIAGNOSIS — R0600 Dyspnea, unspecified: Secondary | ICD-10-CM

## 2017-11-05 ENCOUNTER — Ambulatory Visit (HOSPITAL_COMMUNITY): Payer: Managed Care, Other (non HMO)

## 2017-11-05 ENCOUNTER — Ambulatory Visit (HOSPITAL_COMMUNITY)
Admission: RE | Admit: 2017-11-05 | Discharge: 2017-11-05 | Disposition: A | Discharge status: Home / Self Care | Payer: Managed Care, Other (non HMO) | Source: Ambulatory Visit | Attending: Cardiovascular Disease | Admitting: Cardiovascular Disease

## 2017-11-05 DIAGNOSIS — R079 Chest pain, unspecified: Secondary | ICD-10-CM | POA: Insufficient documentation

## 2017-11-05 MED ORDER — IOPAMIDOL (ISOVUE-370) INJECTION 76%
100.0000 mL | Freq: Once | INTRAVENOUS | Status: AC | PRN
  Administered 2017-11-05: 100 mL via INTRAVENOUS

## 2017-11-05 MED ORDER — NITROGLYCERIN 0.4 MG SL SUBL
0.8000 mg | SUBLINGUAL_TABLET | Freq: Once | SUBLINGUAL | Status: AC
  Administered 2017-11-05: 0.8 mg via SUBLINGUAL
  Filled 2017-11-05: qty 25

## 2017-11-05 MED ORDER — NITROGLYCERIN 0.4 MG SL SUBL
SUBLINGUAL_TABLET | SUBLINGUAL | Status: AC
  Filled 2017-11-05: qty 2

## 2017-11-05 MED ORDER — IOPAMIDOL (ISOVUE-370) INJECTION 76%
INTRAVENOUS | Status: AC
  Filled 2017-11-05: qty 100

## 2020-09-22 IMAGING — CT CT HEART MORP W/ CTA COR W/ SCORE W/ CA W/CM &/OR W/O CM
4 of 7 series · 8 of 20 positions shown, 9 images · IV contrast (APPLIED)
Comparison: None.

CLINICAL DATA: Chest pain

EXAM:
Cardiac CTA
MEDICATIONS:
Sub lingual nitro. 4mg and lopressor 5mg
TECHNIQUE: The patient was scanned on a Siemens [REDACTED]ice scanner. Gantry
rotation speed was 270 msecs. Collimation was .9mm. A 100 kV
prospective scan was triggered in the descending thoracic aorta at
111 HU's with 5% padding centered around 78% of the R-R interval.
Average HR during the scan was 55 bpm. The 3D data set was
interpreted on a dedicated work station using MPR, MIP and VRT
modes. A total of 80 cc of contrast was used.

[Series 6: best diast 77 % · axial · 0.30mm/px · z∈[-88,-45]mm · 2 of 323 slices shown, 3 images]
[im 108/323  vessel]
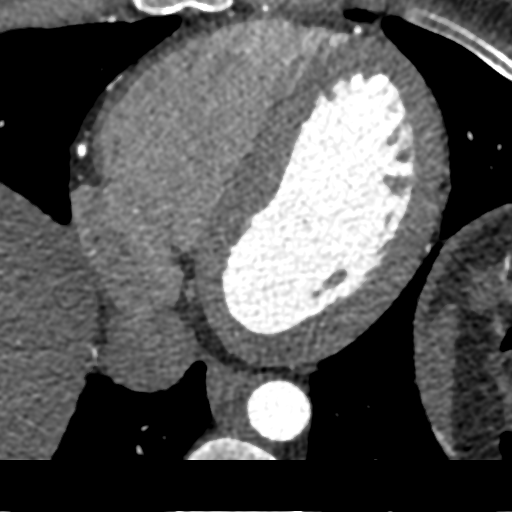
[im 108/323  lung]
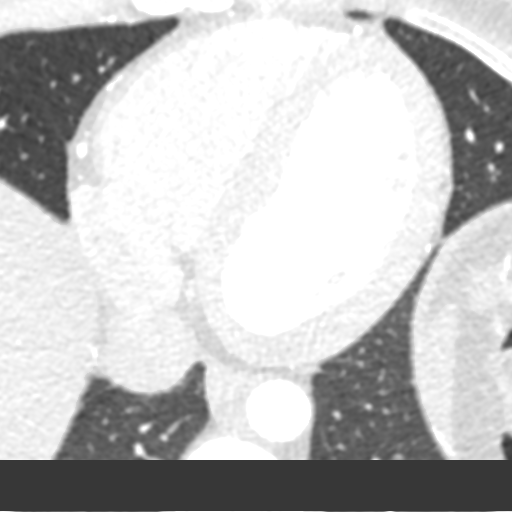
[im 215/323  vessel]
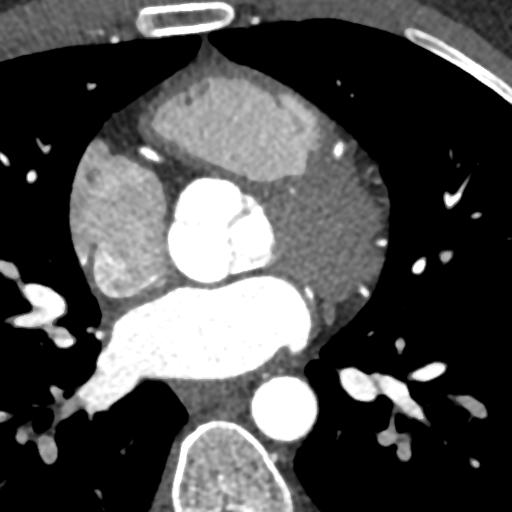

[Series 7: best syst 34 % · axial · 0.30mm/px · z∈[-88,-45]mm · 2 of 323 slices shown]
[im 108/323  vessel]
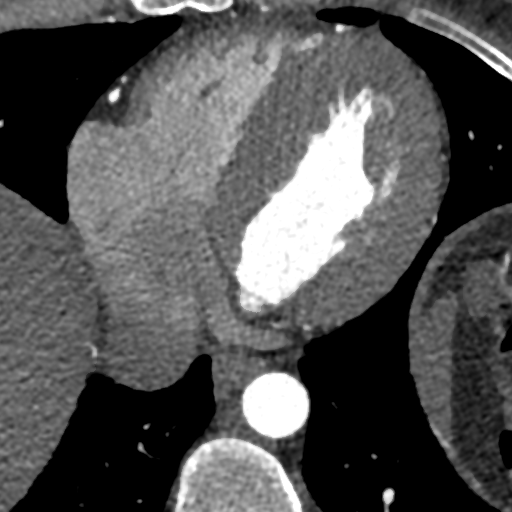
[im 215/323  vessel]
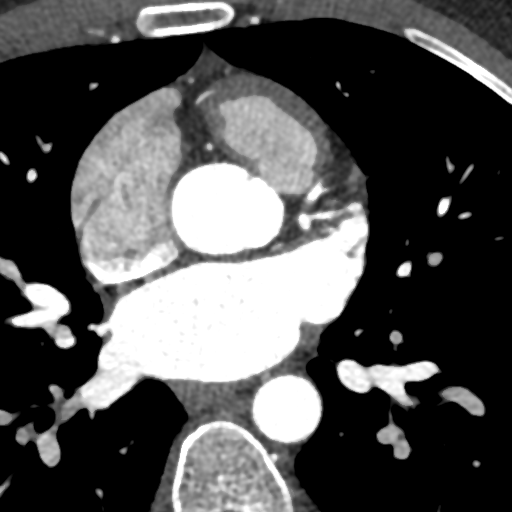

[Series 8: ts diast sharp 77 % · axial · 0.30mm/px · z∈[-88,-45]mm · 2 of 323 slices shown]
[im 108/323  lung]
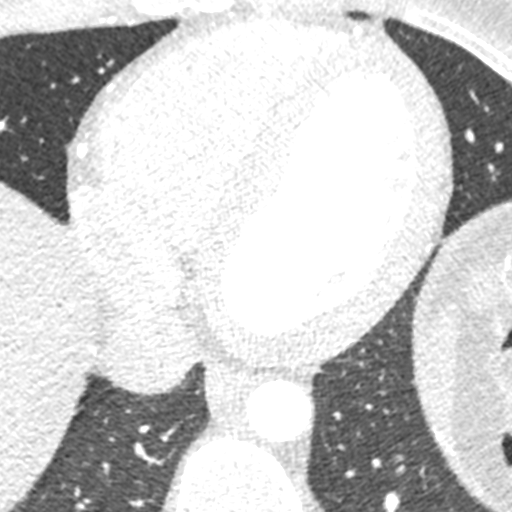
[im 215/323  lung]
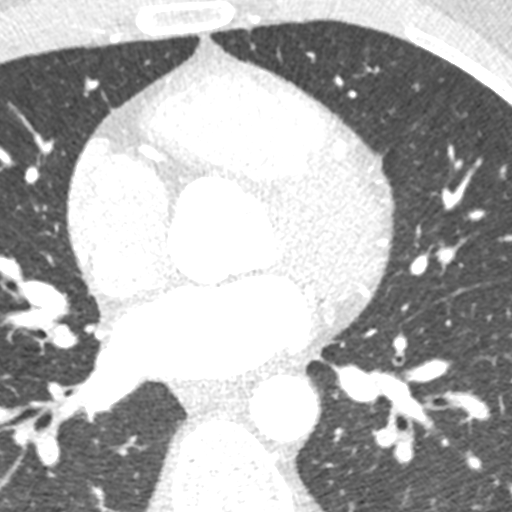

[Series 9: ts syst sharp 34 % · axial · 0.30mm/px · z∈[-88,-45]mm · 2 of 323 slices shown]
[im 108/323  lung]
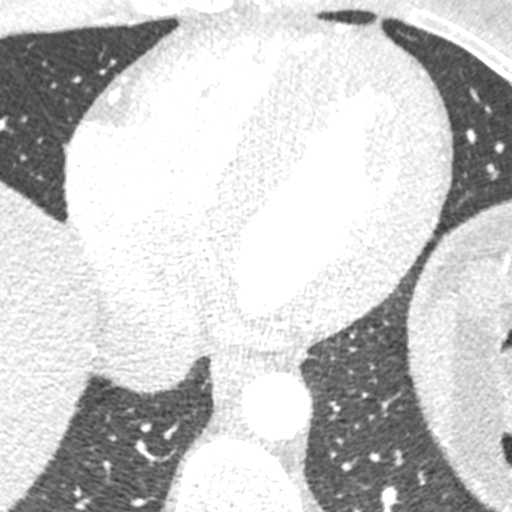
[im 215/323  lung]
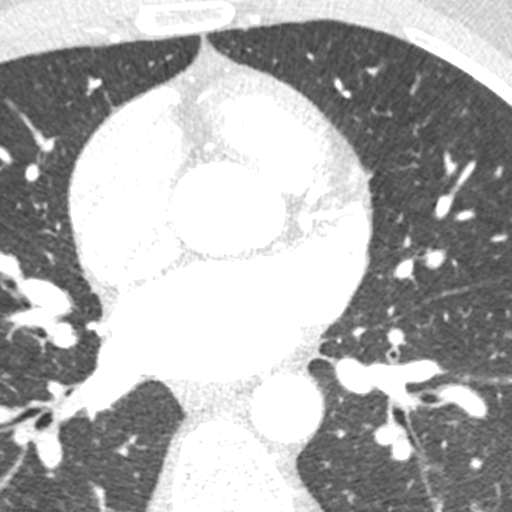

[8 of 20 positions shown; findings below may reference images not displayed]

FINDINGS: Non-cardiac: See separate report from [REDACTED]. No
significant findings on limited lung and soft tissue windows.

Calcium Score: No calcium noted

Coronary Arteries: Right dominant with no anomalies

LM: Normal

LAD:  Normal

D1: Normal

D2: Normal

IM: Large vessel normal

Circumflex: Normal

OM1: Normal

AV groove : Normal

RCA: Normal

PDA: Normal

PLA: Normal
IMPRESSION: 1.  Normal right dominant coronary arteries with no anomalies

2.  Calcium score 0

3.  Normal aortic root 2.9 cm

Mewa Suruj

EXAM:
OVER-READ INTERPRETATION  CT CHEST

The following report is an over-read performed by radiologist Dr.
Manuella Tiger [REDACTED] on 11/05/2017. This
over-read does not include interpretation of cardiac or coronary
anatomy or pathology. The coronary calcium score/coronary CTA
interpretation by the cardiologist is attached.
FINDINGS: Within the visualized portions of the thorax there are no suspicious
appearing pulmonary nodules or masses, there is no acute
consolidative airspace disease, no pleural effusions, no
pneumothorax and no lymphadenopathy. Visualized portions of the
upper abdomen are unremarkable. There are no aggressive appearing
lytic or blastic lesions noted in the visualized portions of the
skeleton.
IMPRESSION: No significant incidental noncardiac findings are noted.
